# Patient Record
Sex: Male | Born: 1960 | Race: White | Hispanic: No | Marital: Married
Health system: Southern US, Community
[De-identification: ages and names within clinical notes are randomized; demographics above are authoritative.]

## PROBLEM LIST (undated history)

## (undated) DIAGNOSIS — S32020A Wedge compression fracture of second lumbar vertebra, initial encounter for closed fracture: Secondary | ICD-10-CM

## (undated) DIAGNOSIS — R519 Headache, unspecified: Secondary | ICD-10-CM

## (undated) DIAGNOSIS — I10 Essential (primary) hypertension: Secondary | ICD-10-CM

## (undated) DIAGNOSIS — F32A Depression, unspecified: Secondary | ICD-10-CM

## (undated) DIAGNOSIS — R51 Headache: Secondary | ICD-10-CM

## (undated) DIAGNOSIS — F329 Major depressive disorder, single episode, unspecified: Secondary | ICD-10-CM

## (undated) DIAGNOSIS — N200 Calculus of kidney: Secondary | ICD-10-CM

## (undated) DIAGNOSIS — E785 Hyperlipidemia, unspecified: Secondary | ICD-10-CM

## (undated) DIAGNOSIS — F419 Anxiety disorder, unspecified: Secondary | ICD-10-CM

## (undated) HISTORY — DX: Headache, unspecified: R51.9

## (undated) HISTORY — DX: Major depressive disorder, single episode, unspecified: F32.9

## (undated) HISTORY — DX: Calculus of kidney: N20.0

## (undated) HISTORY — DX: Wedge compression fracture of second lumbar vertebra, initial encounter for closed fracture: S32.020A

## (undated) HISTORY — DX: Hyperlipidemia, unspecified: E78.5

## (undated) HISTORY — DX: Headache: R51

## (undated) HISTORY — DX: Depression, unspecified: F32.A

## (undated) HISTORY — DX: Anxiety disorder, unspecified: F41.9

## (undated) HISTORY — DX: Essential (primary) hypertension: I10

## (undated) HISTORY — PX: APPENDECTOMY: SHX54

---

## 1999-07-08 ENCOUNTER — Emergency Department (HOSPITAL_COMMUNITY): Admission: EM | Admit: 1999-07-08 | Discharge: 1999-07-08 | Payer: Self-pay

## 2001-05-25 ENCOUNTER — Encounter (INDEPENDENT_AMBULATORY_CARE_PROVIDER_SITE_OTHER): Payer: Self-pay

## 2001-05-25 ENCOUNTER — Ambulatory Visit (HOSPITAL_COMMUNITY): Admission: RE | Admit: 2001-05-25 | Discharge: 2001-05-25 | Payer: Self-pay | Admitting: Gastroenterology

## 2009-06-28 ENCOUNTER — Ambulatory Visit (HOSPITAL_COMMUNITY): Admission: RE | Admit: 2009-06-28 | Discharge: 2009-06-28 | Payer: Self-pay | Admitting: Internal Medicine

## 2009-07-17 ENCOUNTER — Emergency Department (HOSPITAL_COMMUNITY): Admission: EM | Admit: 2009-07-17 | Discharge: 2009-07-17 | Payer: Self-pay | Admitting: Emergency Medicine

## 2009-10-30 ENCOUNTER — Emergency Department (HOSPITAL_COMMUNITY): Admission: EM | Admit: 2009-10-30 | Discharge: 2009-10-30 | Payer: Self-pay | Admitting: Emergency Medicine

## 2010-10-17 LAB — CBC
HCT: 41.8 % (ref 39.0–52.0)
Hemoglobin: 14.5 g/dL (ref 13.0–17.0)
MCHC: 34.7 g/dL (ref 30.0–36.0)
MCV: 94.6 fL (ref 78.0–100.0)
Platelets: 288 10*3/uL (ref 150–400)
RBC: 4.43 MIL/uL (ref 4.22–5.81)
RDW: 13.6 % (ref 11.5–15.5)
WBC: 5.9 10*3/uL (ref 4.0–10.5)

## 2010-10-17 LAB — BASIC METABOLIC PANEL
BUN: 12 mg/dL (ref 6–23)
CO2: 23 mEq/L (ref 19–32)
Calcium: 9.3 mg/dL (ref 8.4–10.5)
Chloride: 104 mEq/L (ref 96–112)
Creatinine, Ser: 0.96 mg/dL (ref 0.4–1.5)
GFR calc Af Amer: >60 mL/min (ref >60)
GFR calc non Af Amer: >60 mL/min (ref >60)
Glucose, Bld: 90 mg/dL (ref 70–99)
Potassium: 3.8 mEq/L (ref 3.5–5.1)
Sodium: 137 mEq/L (ref 135–145)

## 2010-10-17 LAB — DIFFERENTIAL
Basophils Relative: 1 % (ref 0–1)
Eosinophils Absolute: 0.1 10*3/uL (ref 0.0–0.7)
Eosinophils Relative: 1 % (ref 0–5)
Monocytes Absolute: 0.6 10*3/uL (ref 0.1–1.0)
Monocytes Relative: 11 % (ref 3–12)
Neutrophils Relative %: 57 % (ref 43–77)

## 2010-10-29 LAB — URINALYSIS, ROUTINE W REFLEX MICROSCOPIC
Glucose, UA: NEGATIVE mg/dL
Hgb urine dipstick: NEGATIVE
Specific Gravity, Urine: 1.02 (ref 1.005–1.030)
pH: 6 (ref 5.0–8.0)

## 2010-10-29 LAB — COMPREHENSIVE METABOLIC PANEL
ALT: 19 U/L (ref 0–53)
Albumin: 4.2 g/dL (ref 3.5–5.2)
Alkaline Phosphatase: 52 U/L (ref 39–117)
BUN: 12 mg/dL (ref 6–23)
Chloride: 101 mEq/L (ref 96–112)
Glucose, Bld: 102 mg/dL — ABNORMAL HIGH (ref 70–99)
Potassium: 4.3 mEq/L (ref 3.5–5.1)
Sodium: 136 mEq/L (ref 135–145)
Total Bilirubin: 0.8 mg/dL (ref 0.3–1.2)

## 2010-10-29 LAB — DIFFERENTIAL
Basophils Absolute: 0 10*3/uL (ref 0.0–0.1)
Basophils Relative: 0 % (ref 0–1)
Eosinophils Absolute: 0.1 10*3/uL (ref 0.0–0.7)
Monocytes Absolute: 0.8 10*3/uL (ref 0.1–1.0)
Neutro Abs: 3.8 10*3/uL (ref 1.7–7.7)
Neutrophils Relative %: 69 % (ref 43–77)

## 2010-10-29 LAB — CBC
HCT: 41.2 % (ref 39.0–52.0)
Hemoglobin: 14.5 g/dL (ref 13.0–17.0)
WBC: 5.5 10*3/uL (ref 4.0–10.5)

## 2010-10-29 LAB — HEMOCCULT GUIAC POC 1CARD (OFFICE): Fecal Occult Bld: NEGATIVE

## 2010-12-14 NOTE — Procedures (Signed)
[facility]  Patient:    Nicolas Perez, MACOMBER Visit Number: [id number] MRN: [id number]          Service Type: END Location: ENDO Attending Physician:  Orland Mustard Dictated by:   Llana Aliment. Randa Evens, M.D. Proc. Date: 05/25/01 Admit Date:  05/25/2001   CC:         Jamesetta Geralds, M.D.                           Procedure Report  PROCEDURE:  Colonoscopy and biopsy.  SURGEON:  James L. Randa Evens, M.D.  MEDICATIONS:  Fentanyl 112 mcg and Versed 10 mg IV.  SCOPE:  Pediatric Olympus video colonoscope.  INDICATIONS:  Heme positive stool, diarrhea in a gentleman with a strong family history of colon cancer.  DESCRIPTION OF PROCEDURE:  The procedure had been explained to the patient and consent obtained.  With the patient in the left lateral decubitus position, the Olympus video colonoscope of the pediatric-type was inserted and advanced under direct visualization.  The prep was excellent.  We were able to advance to the cecum.  The ileocecal valve was identified and entered for a short distance.  The terminal ileum was normal.  The scope was withdrawn and the cecum, ascending colon, hepatic flexure, transverse, descending, and sigmoid colon were all seen well and were normal.  No significant diverticular disease.  Mucosa normal throughout.  No evidence of colitis.  Several random biopsies taken to evaluate for microscopic colitis.  The scope was withdrawn. The patient tolerated the procedure well.  ASSESSMENT: 1. No evidence of colon polyps. 2. Normal mucosa.  No evidence of colitis or ileitis.  PLAN:  Would recommend repeating in five years due to his family history of colon cancer, and will see back in the office in two months to recheck his stools. Dictated by:   Llana Aliment. Randa Evens, M.D. Attending Physician:  Orland Mustard DD:  05/25/01 TD:  05/26/01 Job: [id number]

## 2011-02-15 ENCOUNTER — Emergency Department (HOSPITAL_COMMUNITY): Payer: Managed Care, Other (non HMO)

## 2011-02-15 ENCOUNTER — Inpatient Hospital Stay (HOSPITAL_COMMUNITY)
Admission: EM | Admit: 2011-02-15 | Discharge: 2011-02-17 | DRG: 313 | Disposition: A | Discharge status: Home / Self Care | Payer: Managed Care, Other (non HMO) | Attending: Internal Medicine | Admitting: Internal Medicine

## 2011-02-15 DIAGNOSIS — K219 Gastro-esophageal reflux disease without esophagitis: Secondary | ICD-10-CM | POA: Diagnosis present

## 2011-02-15 DIAGNOSIS — G252 Other specified forms of tremor: Secondary | ICD-10-CM | POA: Diagnosis present

## 2011-02-15 DIAGNOSIS — F341 Dysthymic disorder: Secondary | ICD-10-CM | POA: Diagnosis present

## 2011-02-15 DIAGNOSIS — R0789 Other chest pain: Principal | ICD-10-CM | POA: Diagnosis present

## 2011-02-15 DIAGNOSIS — F1010 Alcohol abuse, uncomplicated: Secondary | ICD-10-CM | POA: Diagnosis present

## 2011-02-15 DIAGNOSIS — E785 Hyperlipidemia, unspecified: Secondary | ICD-10-CM | POA: Diagnosis present

## 2011-02-15 DIAGNOSIS — G43909 Migraine, unspecified, not intractable, without status migrainosus: Secondary | ICD-10-CM | POA: Diagnosis present

## 2011-02-15 DIAGNOSIS — G250 Essential tremor: Secondary | ICD-10-CM | POA: Diagnosis present

## 2011-02-15 DIAGNOSIS — I10 Essential (primary) hypertension: Secondary | ICD-10-CM | POA: Diagnosis present

## 2011-02-15 LAB — CK TOTAL AND CKMB (NOT AT ARMC)
CK, MB: 1.8 ng/mL (ref 0.3–4.0)
Relative Index: 1.4 (ref 0.0–2.5)

## 2011-02-15 LAB — COMPREHENSIVE METABOLIC PANEL
ALT: 31 U/L (ref 0–53)
Alkaline Phosphatase: 50 U/L (ref 39–117)
BUN: 15 mg/dL (ref 6–23)
CO2: 24 mEq/L (ref 19–32)
Chloride: 97 mEq/L (ref 96–112)
GFR calc Af Amer: >60 mL/min (ref >60)
GFR calc non Af Amer: >60 mL/min (ref >60)
Glucose, Bld: 80 mg/dL (ref 70–99)
Potassium: 3.7 mEq/L (ref 3.5–5.1)
Sodium: 133 mEq/L — ABNORMAL LOW (ref 135–145)
Total Bilirubin: 0.3 mg/dL (ref 0.3–1.2)

## 2011-02-15 LAB — CBC
Hemoglobin: 15.2 g/dL (ref 13.0–17.0)
MCH: 31.2 pg (ref 26.0–34.0)
MCHC: 35.1 g/dL (ref 30.0–36.0)
Platelets: 235 10*3/uL (ref 150–400)

## 2011-02-15 LAB — PROTIME-INR
INR: 0.92 (ref 0.00–1.49)
Prothrombin Time: 12.6 seconds (ref 11.6–15.2)

## 2011-02-15 LAB — MAGNESIUM: Magnesium: 2.3 mg/dL (ref 1.5–2.5)

## 2011-02-16 LAB — CARDIAC PANEL(CRET KIN+CKTOT+MB+TROPI)
CK, MB: 1.4 ng/mL (ref 0.3–4.0)
Relative Index: INVALID (ref 0.0–2.5)
Relative Index: INVALID (ref 0.0–2.5)
Troponin I: <0.3 ng/mL (ref <0.30)
Troponin I: <0.3 ng/mL (ref <0.30)

## 2011-02-16 LAB — LIPID PANEL
HDL: 49 mg/dL (ref >39)
LDL Cholesterol: 181 mg/dL — ABNORMAL HIGH (ref 0–99)
Triglycerides: 139 mg/dL (ref <150)

## 2011-02-17 LAB — CBC
HCT: 45.3 % (ref 39.0–52.0)
Hemoglobin: 15.1 g/dL (ref 13.0–17.0)
MCH: 30 pg (ref 26.0–34.0)
MCHC: 33.3 g/dL (ref 30.0–36.0)
RBC: 5.03 MIL/uL (ref 4.22–5.81)

## 2011-02-17 LAB — CARDIAC PANEL(CRET KIN+CKTOT+MB+TROPI)
CK, MB: 1.3 ng/mL (ref 0.3–4.0)
Relative Index: INVALID (ref 0.0–2.5)
Relative Index: INVALID (ref 0.0–2.5)
Total CK: 84 U/L (ref 7–232)
Troponin I: <0.3 ng/mL (ref <0.30)
Troponin I: <0.3 ng/mL (ref <0.30)
Troponin I: <0.3 ng/mL (ref <0.30)

## 2011-02-17 LAB — BASIC METABOLIC PANEL
BUN: 16 mg/dL (ref 6–23)
CO2: 27 mEq/L (ref 19–32)
Calcium: 9.6 mg/dL (ref 8.4–10.5)
GFR calc non Af Amer: >60 mL/min (ref >60)
Glucose, Bld: 98 mg/dL (ref 70–99)
Sodium: 136 mEq/L (ref 135–145)

## 2011-02-17 LAB — VITAMIN B12: Vitamin B-12: 425 pg/mL (ref 211–911)

## 2011-02-18 NOTE — H&P (Signed)
NAME:  Nicolas Perez, Nicolas Perez NO.:  [id number]  MEDICAL RECORD NO.:  [id number]  LOCATION:  WLED                         FACILITY:  [facility]  PHYSICIAN:  Della Goo, M.D. DATE OF BIRTH:  1960/12/10  DATE OF ADMISSION:  02/15/2011 DATE OF DISCHARGE:                             HISTORY & PHYSICAL   DATE OF ADMISSION:  02/15/2011  PRIMARY CARE PHYSICIAN:  Fleet Contras, M.D.  CHIEF COMPLAINT:  Chest pain, headache.  HISTORY OF PRESENT ILLNESS:  This is a 50 year old male with a history of hypertension who presents to the emergency department at [facility] secondary to complaints of severe stabbing chest pain which started when he was at work on his job at 3:00 p.m. today.  The patient states the pain was worse than it had ever been.  The pain radiated across his chest.  He reported having associated symptoms of sweating and shortness of breath.  He states the pain lasted about 12 to 15 minutes and then began to slowly resolve.  He reported the pain was 10/10 at the worst and then began to decrease to about 3/10.  When he was at work, he went to the nurse on the job for evaluation and was  sent to the emergency department for further evaluation.   The patient also stated that he has been having a headache for the past 2 weeks on the left side of his head radiating down the back of his head into his neck and shoulders.  He reports at times he has a headache behind his left eye.  He has been taking Naprosyn 2 to 3 times a day for this.    The patient does have a family history of coronary artery disease.  His sister and father had coronary artery disease.  The patient is a nonsmoker.  PAST MEDICAL HISTORY:  Significant for hypertension, hyperlipidemia, nephrolithiasis, compression fracture at L2, anxiety and depression.  PAST SURGICAL HISTORY:  Appendectomy.  MEDICATIONS:  Will need to be further verified.  He takes Naprosyn, Lomotil, Zoloft, Xanax  and Norvasc.  ALLERGIES:  TETRACYCLINE.  SOCIAL HISTORY:  The patient is married, with children.  He is a nonsmoker.  He reports drinking alcohol variable amounts.  He states at times he drinks none and some nights he drinks 2 beers or 4 beers or 6 beers.  He denies any illicit drug usage.  FAMILY HISTORY:  Positive for coronary artery disease in his sister and father.  Sister died of Tessie Fass disease in her 31s.  Mother had hypertension, stroke and had breast cancer in which she underwent treatment and had subsequent leukemia and died.  REVIEW OF SYSTEMS:  Pertinents as mentioned in HPI.  PHYSICAL EXAMINATION FINDINGS:  GENERAL:  This is a 50 year old well- nourished, well-developed Caucasian male who is in no visible discomfort or acute distress. VITAL SIGNS:  Temperature 98.0, blood pressure initially 168/121, heart rate 80, respirations 20, O2 sats 100% on supplemental oxygen. HEENT EXAMINATION:  Normocephalic, atraumatic.  Pupils equally round and reactive to light.  Extraocular movements are intact.  Funduscopic benign.  There is no scleral icterus.  Nares are patent bilaterally. Oropharynx is  clear. NECK:  Supple.  Full range of motion.  No thyromegaly, adenopathy, or jugular venous distention. CARDIOVASCULAR:  Regular rate and rhythm.  No murmurs, gallops or rubs appreciated.  Normal S1, S2. LUNGS:  Clear to auscultation bilaterally.  Normal excursion.  Breathing is unlabored.  Chest wall is nontender.  Nondisplaced PMI.Marland Kitchen  ABDOMEN: Positive bowel sounds, soft, nontender, nondistended.  No hepatosplenomegaly.  No rebound, no guarding. EXTREMITIES:  Without cyanosis, clubbing or edema. NEUROLOGIC EXAMINATION:  The patient is alert and oriented x3.  Cranial nerves are intact.  Motor and sensory function also intact.  Cerebellar function intact.  Gait has not been assessed.  LABORATORY STUDIES:  White blood cell count 6.6, hemoglobin 15.2, hematocrit 43.3, platelets  135.  Protime 12.6, INR 0.92 and PTT 28. Sodium 133, potassium 3.7, chloride 97, CO2 of 24, BUN 15, creatinine 1.04, glucose 80, albumin 4.3, AST 23, ALT 31, calcium 9.9.  Cardiac enzymes with total CK of 128, CK-MB 1.8, relative index 1.4 and troponin less than 0.30.  Magnesium level 2.3.  Chest x-ray reveals no acute disease findings.  CT scan of the head performed revealing no acute intracranial hemorrhage.  No focal mass lesions.  No signs of acute infarction.  No midline shift or mass effect.  Normal head CT.  EKG reveals a normal sinus rhythm and no acute ST-segment changes were seen.  ASSESSMENT:  A 50 year old male being admitted with: 1. Chest pain. 2. Malignant hypertension/hypertensive urgency. 3. Cephalgia secondary to number #2. 4. Hyperlipidemia. 5. Anxiety and depression.  PLAN:  The patient will be admitted to a telemetry area for cardiac monitoring and cardiac enzymes will be performed.  The patient will be placed on nitroglycerin paste, oxygen, aspirin therapies and p.r.n. medications will be ordered for his elevated blood pressures.  His regular medications will be further verified as well.  He is already on Norvasc.  For his blood pressure, it is not clear at this time whether the patient has controlled blood pressure on his current medications. Pain medication will also be ordered for his headache.  This may be a tension headache. However, the headache may also be due to uncontrolled hypertension.  The patient will be placed on DVT prophylaxis and further workup will ensue pending results of his clinical studies and his clinical course.  The patient is a full code.     Della Goo, M.D.     HJ/MEDQ  D:  02/15/2011  T:  02/15/2011  Job:  [id number]  cc:   Fleet Contras, M.D. Fax: [phone]  Electronically Signed by Della Goo M.D. on 02/18/2011 12:49:27 PM

## 2011-02-25 ENCOUNTER — Telehealth: Payer: Self-pay | Admitting: *Deleted

## 2011-02-25 NOTE — Telephone Encounter (Signed)
Called patient and advised him that his insurance carrier would not cover a stress myoview but would cover a stress echo. This change was approved by Dr.Ross.  The patient will come tomorrow for a stress echo at 345pm for a 4 pm test.

## 2011-02-26 ENCOUNTER — Encounter (HOSPITAL_COMMUNITY): Payer: Managed Care, Other (non HMO) | Admitting: Radiology

## 2011-02-26 ENCOUNTER — Ambulatory Visit (HOSPITAL_COMMUNITY): Payer: Managed Care, Other (non HMO) | Attending: Internal Medicine

## 2011-02-26 ENCOUNTER — Ambulatory Visit (HOSPITAL_COMMUNITY): Payer: Managed Care, Other (non HMO) | Attending: Internal Medicine | Admitting: Radiology

## 2011-02-26 DIAGNOSIS — R072 Precordial pain: Secondary | ICD-10-CM | POA: Insufficient documentation

## 2011-02-26 DIAGNOSIS — R0989 Other specified symptoms and signs involving the circulatory and respiratory systems: Secondary | ICD-10-CM

## 2011-02-26 DIAGNOSIS — E785 Hyperlipidemia, unspecified: Secondary | ICD-10-CM | POA: Insufficient documentation

## 2011-02-26 DIAGNOSIS — R079 Chest pain, unspecified: Secondary | ICD-10-CM | POA: Insufficient documentation

## 2011-02-26 DIAGNOSIS — I10 Essential (primary) hypertension: Secondary | ICD-10-CM | POA: Insufficient documentation

## 2011-02-26 DIAGNOSIS — Z8249 Family history of ischemic heart disease and other diseases of the circulatory system: Secondary | ICD-10-CM | POA: Insufficient documentation

## 2011-02-26 DIAGNOSIS — R0789 Other chest pain: Secondary | ICD-10-CM

## 2011-02-26 MED ORDER — ATROPINE SULFATE 0.1 MG/ML IJ SOLN
0.5000 mg | Freq: Once | INTRAMUSCULAR | Status: AC
  Administered 2011-02-26: 0.5 mg via INTRAVENOUS

## 2011-02-26 MED ORDER — SODIUM CHLORIDE 0.9 % IV SOLN: 40.0000 ug/kg | Freq: Once | INTRAVENOUS | Status: DC

## 2011-02-27 ENCOUNTER — Encounter: Payer: Self-pay | Admitting: *Deleted

## 2011-03-01 ENCOUNTER — Encounter: Payer: Self-pay | Admitting: Internal Medicine

## 2011-03-04 ENCOUNTER — Ambulatory Visit: Payer: Managed Care, Other (non HMO) | Admitting: Internal Medicine

## 2011-03-15 NOTE — Discharge Summary (Signed)
NAME:  Nicolas Perez, Nicolas Perez NO.:  [id number]  MEDICAL RECORD NO.:  [id number]  LOCATION:  1412                         FACILITY:  [facility]  PHYSICIAN:  Rosanna Randy, MDDATE OF BIRTH:  June 30, 1961  DATE OF ADMISSION:  02/15/2011 DATE OF DISCHARGE:  02/17/2011                              DISCHARGE SUMMARY   PRIMARY CARE PHYSICIAN:  Fleet Contras, MD.  DISCHARGE DIAGNOSES: 1. Atypical chest pain with typical features, negative cardiac     markers, and no electrocardiographic changes. 2. Essential tremors. 3. Hyperlipidemia. 4. Accelerated hypertension. 5. Migraines. 6. Gastroesophageal reflux disease. 7. Anxiety and depression. 8. Alcohol abuse.  DISCHARGE MEDICATIONS: 1. Amlodipine 10 mg by mouth daily. 2. Aspirin 81 mg 1 tablet by mouth daily. 3. Metoprolol 25 mg by mouth twice a day. 4. Pravachol 40 mg by mouth daily. 5. Protonix 40 mg by mouth daily. 6. Topiramate 25 mg by mouth daily at bedtime.7. Alprazolam 1 mg 1 tablet by mouth 3 times a day as needed for     anxiety. 8. Sertraline 100 mg 2 tablets by mouth every morning.  DISPOSITION AND FOLLOWUP:  Patient had been discharged in stable and improved condition, currently not complaining of any chest pain.  No shortness of breath, palpitations or any other acute discomfort. Patient had been instructed to follow a low-sodium diet, to cut down and quit drinking alcohol, and to arrange a followup appointment with primary care physician over the next 7-10 days.  It will be important at that time to repeat the basic metabolic panel to follow on electrolytes and also renal function and to adjust the patient's blood pressure medications as needed after following on his vital signs.  Patient will also call [facility] in order to arrange a stress test as an outpatient for his chest pain with typical features and risk factors in order to finish a stratification workup for his main  complaint.  PROCEDURES PERFORMED DURING THIS HOSPITALIZATION:  Patient has a CT of the head without contrast due to the symptoms of headache associated with his accelerated hypertension.  Patient has no acute intracranial abnormalities seen on the CT.  He also had on February 15, 2011, a chest x- ray 2 views that demonstrated no acute cardiopulmonary process.  No other procedures were performed during this admission.  A curbside telephone call to [facility] was made in order to arrange outpatient stress test for Mr. [name].  No other consultations were made during this admission.  HISTORY OF PRESENT ILLNESS:  For full details, please refer to dictation done by Dr. Lovell Sheehan on February 15, 2011.  Briefly, this is a 50 year old male with a history of hypertension, hyperlipidemia, family history of coronary artery disease, and alcohol abuse, who came into the hospital complaining of significant stabbing chest pain that lasted about 12-15 minutes, 10/10, associated with shortness of breath and diaphoresis.  LABORATORY DATA:  Pertinent laboratory data throughout this admission: A CBC with differential demonstrated white blood cells of 6.6, hemoglobin 15.2, platelets 235.  PT 12.6, creatinine 0.92, PTT 28. Cardiac markers negative x4.  Magnesium 2.3.  TSH 3.820.  A lipid profile demonstrated LDL of 181  and total cholesterol of 258, triglyceride 139 and HDL 49, a vitamin B12 level 425.  At discharge, CBC showed a white blood cells of 5.1, hemoglobin 15.1, platelets 237, MCV was 90.1.  BMET:  Sodium 136, potassium 4.0, chloride 100, bicarb 27, glucose 98, BUN 16, creatinine 0.93, calcium 9.6.  HOSPITAL COURSE BY PROBLEM: 1. Patient's chest pain, atypical with typical features due to his     family history:  A stress test as an outpatient is going to be     arranged through [facility].  In this hospitalization,     cardiac markers were negative x4.  There were no EKG changes,  and     telemetry without abnormality.  Patient was started on beta     blocker, aspirin, and also statins. 2. Accelerated hypertension:  Improved with the use of nitrate,     medications adjusted.  He is now on Lopressor, 10 mg of amlodipine     daily, and will follow with primary care physician in order to have     further adjustment as needed to control his blood pressure.     Patient will also follow a low-sodium diet. 3. Essential tremors:  We are expecting that this is going to improve     with the use of beta-blockers.  He will follow with primary care     physician for further adjustment as needed. 4. Hyperlipidemia:  Patient started on Pravachol 40 mg by mouth daily     and instructed to follow a low-fat diet. 5. Migraines:  He had been started on Topamax and will continue using     Tylenol as needed for pain control. 6. Gastroesophageal reflux disease:  We are going to continue using     Protonix. 7. Anxiety and depression:  Patient is to continue the use of     alprazolam and sertraline.  DISCHARGE PHYSICAL EXAMINATION:  VITAL SIGNS:  Temperature 97.4, heart rate 73, respiratory rate 18, blood pressure 136/89, oxygen saturation 98% on room air. GENERAL:  Patient in no acute distress. RESPIRATORY SYSTEM:  Clear to auscultation bilaterally.  No rhonchi, wheezing, or crackles. HEART:  Regular rate and rhythm.  No murmurs.  No gallops. ABDOMEN:  Soft.  No tenderness.  No distention.  Positive bowel sounds. EXTREMITIES:  No edema.  Bilateral feet on the sole demonstrated urticaria rash that will need to be followed as an outpatient with primary care physician for further workup. NEUROLOGIC:  Nonfocal.     Rosanna Randy, MD     CEM/MEDQ  D:  02/17/2011  T:  02/17/2011  Job:  [id number]  cc:   Fleet Contras, M.D. Fax: [phone]  Electronically Signed by Vassie Loll MD on 03/15/2011 05:41:36 PM

## 2012-04-28 IMAGING — CT CT HEAD W/O CM
1 series · 16 of 30 positions shown, 20 images · non-contrast
Comparison: None.

CLINICAL DATA: Headache, blurred vision

CT HEAD WITHOUT CONTRAST
TECHNIQUE: Contiguous axial images were obtained from the base of
the skull through the vertex without contrast.

[Series 2: head_seq 4.5 h37s st · axial · 0.43mm/px · z∈[-112,+32]mm · 16 of 36 slices shown, 20 images]
[im 2/36  brain]
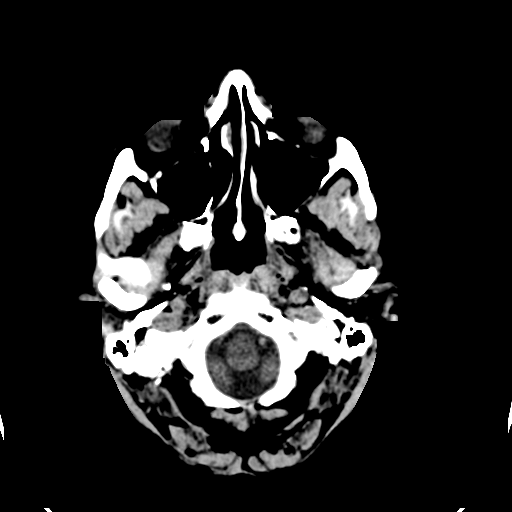
[im 2/36  bone]
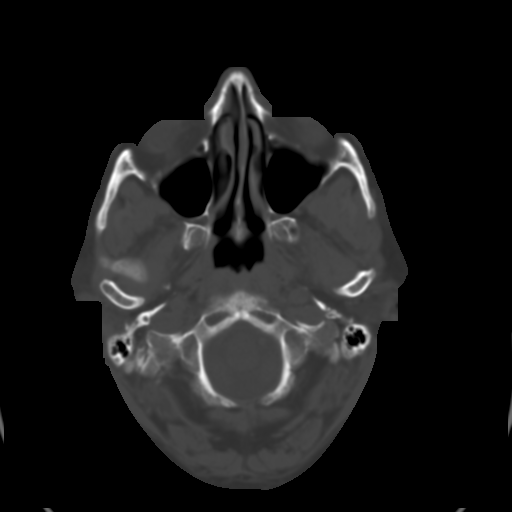
[im 4/36  brain]
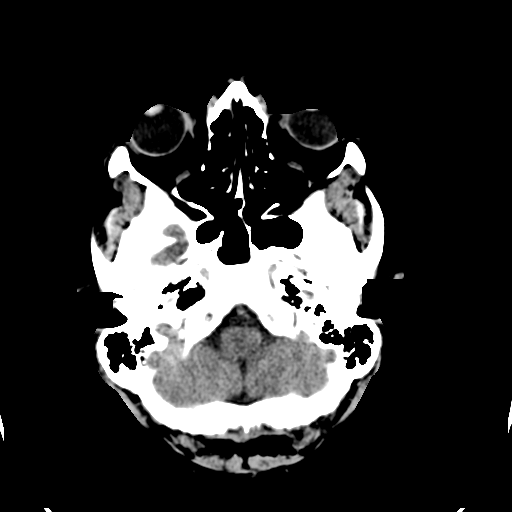
[im 7/36  brain]
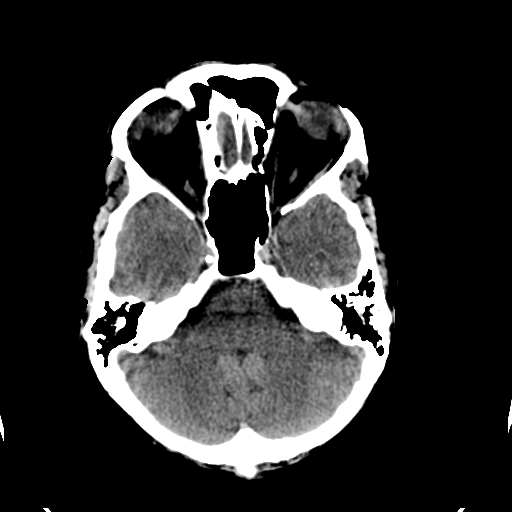
[im 9/36  brain]
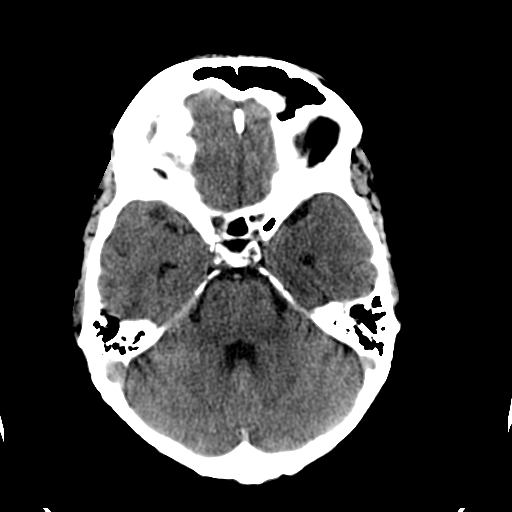
[im 10/36  brain]
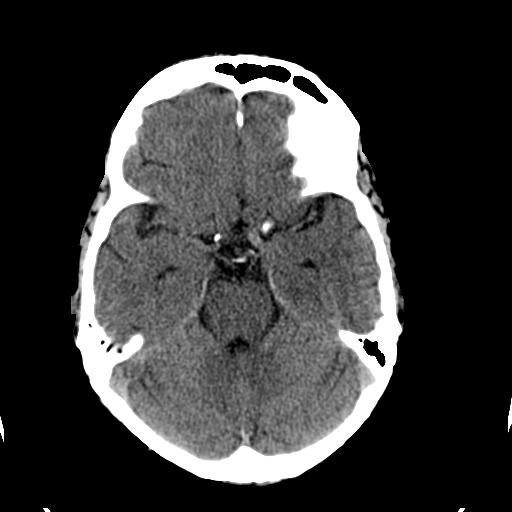
[im 10/36  bone]
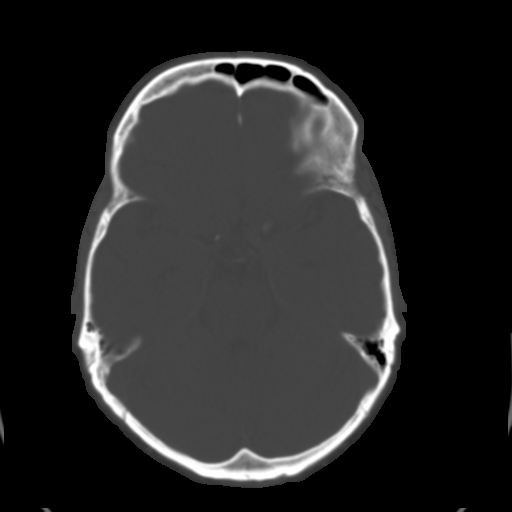
[im 13/36  brain]
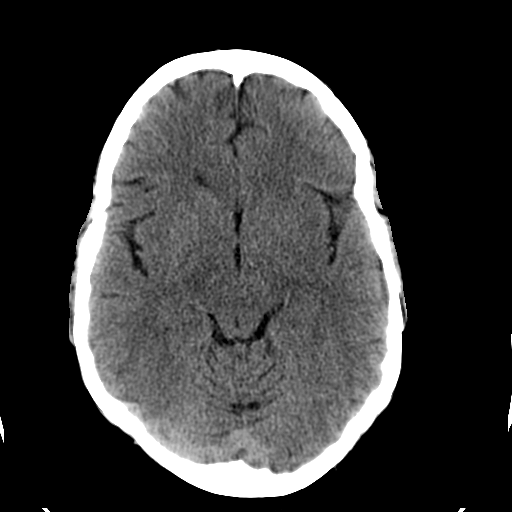
[im 15/36  brain]
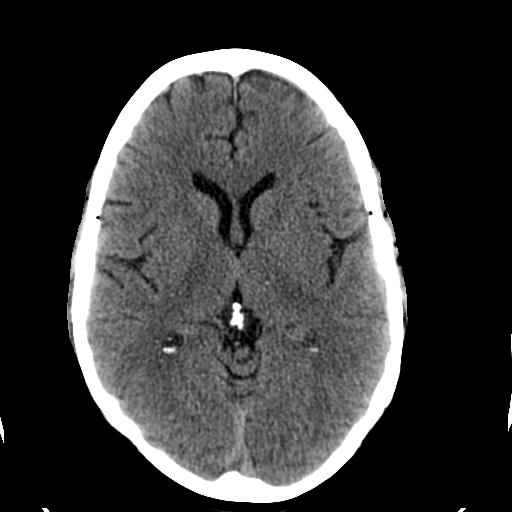
[im 17/36  brain]
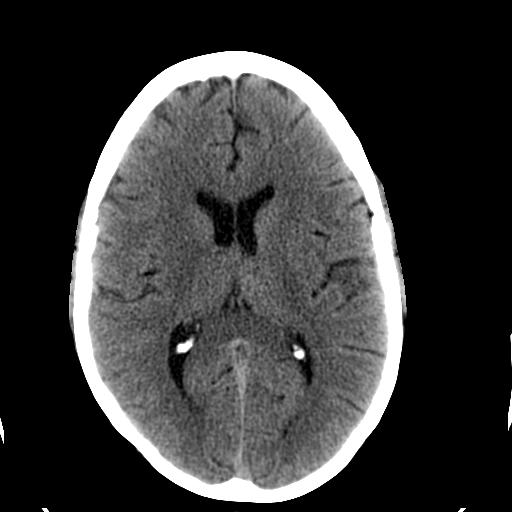
[im 19/36  brain]
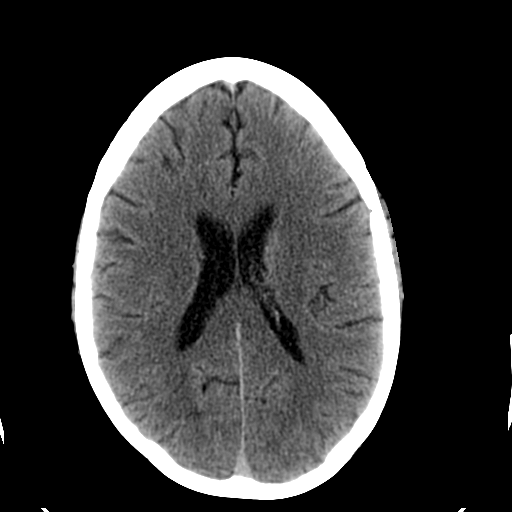
[im 19/36  bone]
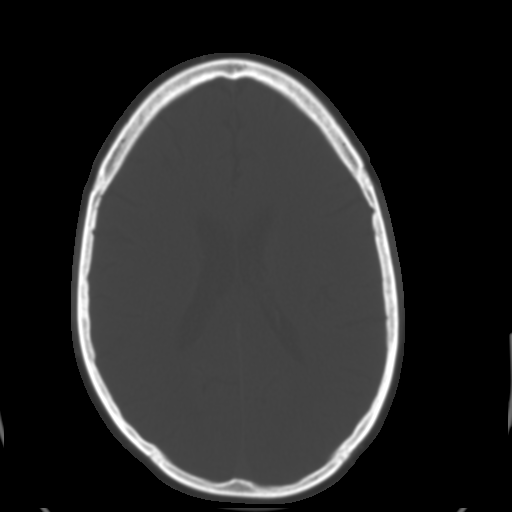
[im 21/36  brain]
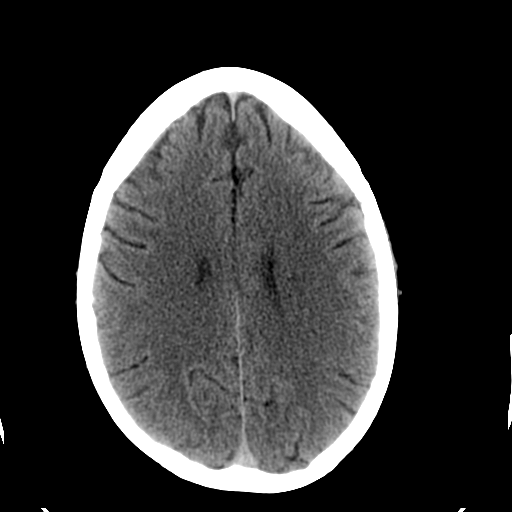
[im 23/36  brain]
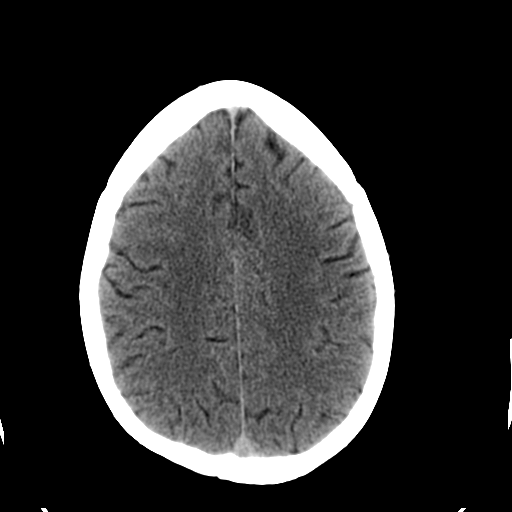
[im 26/36  brain]
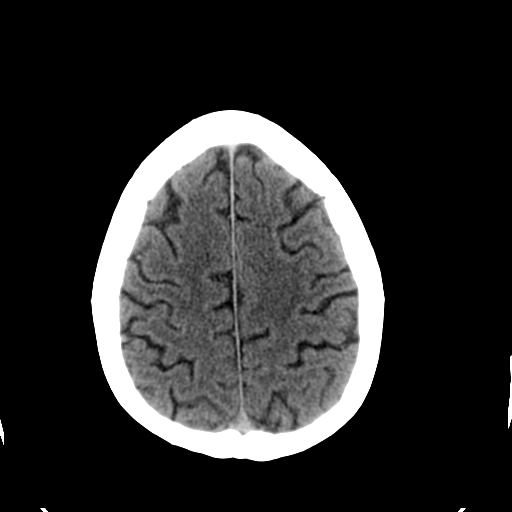
[im 27/36  brain]
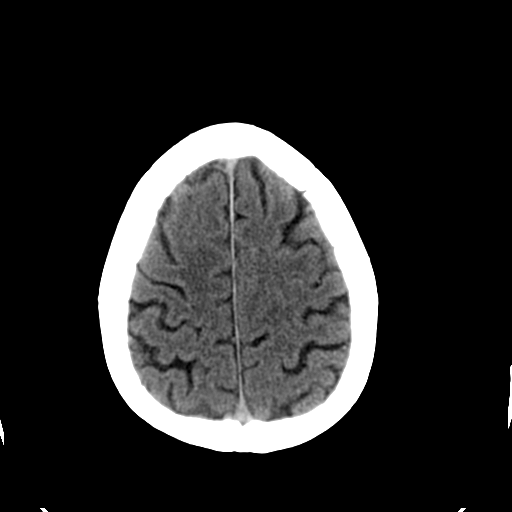
[im 27/36  bone]
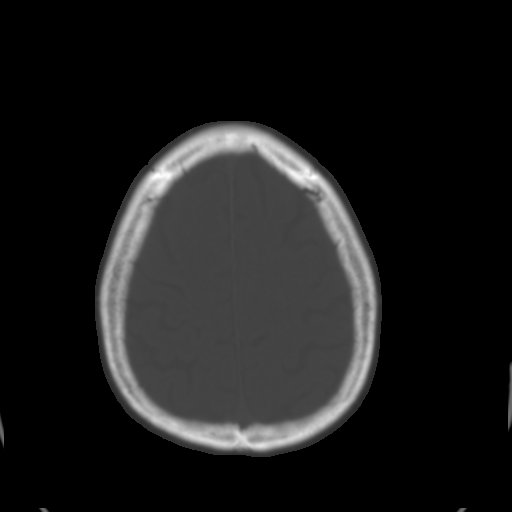
[im 29/36  brain]
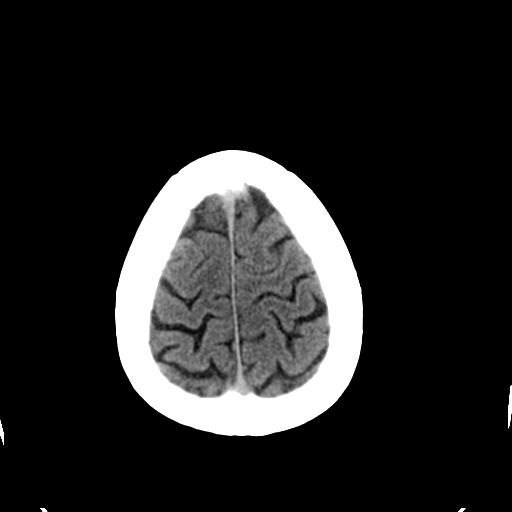
[im 32/36  brain]
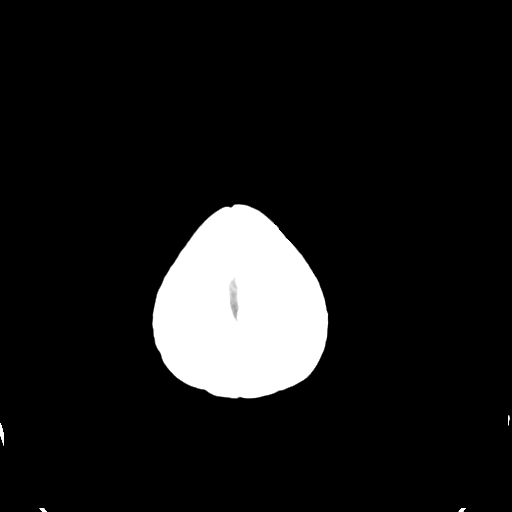
[im 34/36  brain]
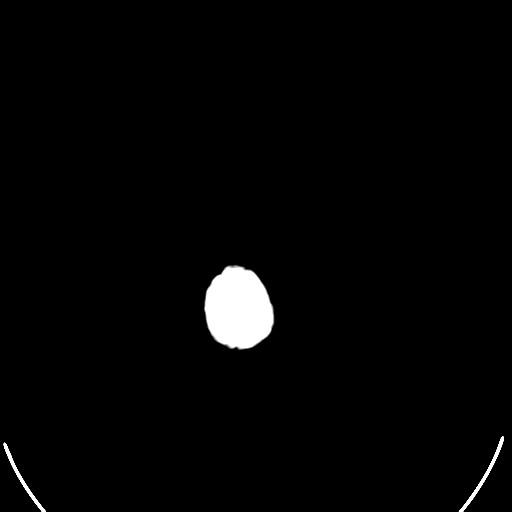

[16 of 30 positions shown; findings below may reference images not displayed]

FINDINGS: No acute intracranial hemorrhage.  No focal mass lesion.
No CT evidence of acute infarction.  No midline shift or mass
effect.

Paranasal sinuses and mastoid air cells are clear.  Orbits are
normal.
IMPRESSION: Normal head CT.

## 2013-09-22 ENCOUNTER — Encounter: Payer: Self-pay | Admitting: Internal Medicine

## 2016-09-19 ENCOUNTER — Emergency Department (HOSPITAL_COMMUNITY): Payer: BLUE CROSS/BLUE SHIELD

## 2016-09-19 ENCOUNTER — Emergency Department (HOSPITAL_COMMUNITY)
Admission: EM | Admit: 2016-09-19 | Discharge: 2016-09-19 | Disposition: A | Discharge status: Home / Self Care | Payer: BLUE CROSS/BLUE SHIELD | Attending: Emergency Medicine | Admitting: Emergency Medicine

## 2016-09-19 ENCOUNTER — Encounter (HOSPITAL_COMMUNITY): Payer: Self-pay | Admitting: Emergency Medicine

## 2016-09-19 DIAGNOSIS — Z79899 Other long term (current) drug therapy: Secondary | ICD-10-CM | POA: Insufficient documentation

## 2016-09-19 DIAGNOSIS — R0789 Other chest pain: Secondary | ICD-10-CM | POA: Diagnosis present

## 2016-09-19 DIAGNOSIS — F419 Anxiety disorder, unspecified: Secondary | ICD-10-CM

## 2016-09-19 DIAGNOSIS — I10 Essential (primary) hypertension: Secondary | ICD-10-CM | POA: Insufficient documentation

## 2016-09-19 LAB — BASIC METABOLIC PANEL
Anion gap: 6 (ref 5–15)
BUN: 12 mg/dL (ref 6–20)
CO2: 24 mmol/L (ref 22–32)
Calcium: 9.1 mg/dL (ref 8.9–10.3)
Chloride: 105 mmol/L (ref 101–111)
Creatinine, Ser: 0.85 mg/dL (ref 0.61–1.24)
GFR calc Af Amer: >60 mL/min (ref >60)
GFR calc non Af Amer: >60 mL/min (ref >60)
Glucose, Bld: 110 mg/dL — ABNORMAL HIGH (ref 65–99)
Potassium: 4.3 mmol/L (ref 3.5–5.1)
Sodium: 135 mmol/L (ref 135–145)

## 2016-09-19 LAB — I-STAT TROPONIN, ED
Troponin i, poc: 0 ng/mL (ref 0.00–0.08)
Troponin i, poc: 0 ng/mL (ref 0.00–0.08)

## 2016-09-19 LAB — CBC
HCT: 44.1 % (ref 39.0–52.0)
Hemoglobin: 15.6 g/dL (ref 13.0–17.0)
MCH: 32.4 pg (ref 26.0–34.0)
MCHC: 35.4 g/dL (ref 30.0–36.0)
MCV: 91.5 fL (ref 78.0–100.0)
Platelets: 289 10*3/uL (ref 150–400)
RBC: 4.82 MIL/uL (ref 4.22–5.81)
RDW: 13.3 % (ref 11.5–15.5)
WBC: 5 10*3/uL (ref 4.0–10.5)

## 2016-09-19 NOTE — ED Triage Notes (Signed)
Patient c/o right sided chest pain since yesterday at work. Pt reports pain radiates to back and felt like he was going to pass out when pain came on.  Patient states his BP has been running high here lately and lots of stress at work.

## 2016-09-19 NOTE — Discharge Instructions (Signed)
Please follow up with your primary care provider for further evaluation of your chest pain.  You will likely benefit from a cardiac stress test outpatient if your primary care provider deem appropriate.  Please decrease alcohol intake.  Return to the ER if your condition worsen or if you have other concerns.

## 2016-09-19 NOTE — ED Provider Notes (Signed)
[facility]-EMERGENCY DEPT Provider Note   CSN: [id number] Arrival date & time: 09/19/16  1054     History   Chief Complaint Chief Complaint  Patient presents with  . Chest Pain    HPI Nicolas Perez is a 56 y.o. male.  HPI   56 year old male with history of hyperlipidemia, hypertension, anxiety resenting complaining of chest pain. Patient report for the past week he has been expressing intermittent chest discomfort usually brought on by stress. He described his chest discomfort as a pressure sensation to his mid chest, radiates to his right shoulder and arm, usually lasting for 3-5 minutes and occasionally radiates to his back. Yesterday he expressing moderate discomfort after he had "a discussion" at his desk at work. He went home to check his blood pressure and states that it was elevated in the 160s/170 systolic. He tries taking his blood pressure medication with throughout the day today he has been having this same discomfort to his chest. Currently rated as 7 out of 10. Endorse occasional shortness of breath but not always. Denies any exertional chest pain lightheadedness of dizziness. He denies tobacco use and admits to drinking alcohol regularly. Does report family history of cardiac disease but no premature cardiac death. No prior history of PE or DVT, no recent surgery, prolonged bed rest, unilateral leg swelling or calf pain. He has not had any provocative cardiac testing within the past 5 years. He report taking Xanax does help with his chest pain. He denies any strenuous activities or heavy lifting.  Past Medical History:  Diagnosis Date  . Anxiety   . Cephalgia   . Chest pain   . Compression fracture of L2 lumbar vertebra (HCC)   . Depression   . Hyperlipidemia   . Hypertension   . Nephrolithiasis     There are no active problems to display for this patient.   Past Surgical History:  Procedure Laterality Date  . APPENDECTOMY         Home Medications    Prior to  Admission medications   Medication Sig Start Date End Date Taking? Authorizing Provider  ALPRAZolam (XANAX PO) Take by mouth.      Historical Provider, MD  AmLODIPine Besylate (NORVASC PO) Take by mouth.      Historical Provider, MD  Diphenoxylate-Atropine (LOMOTIL PO) Take by mouth.      Historical Provider, MD  Naproxen (NAPROSYN PO) Take by mouth.      Historical Provider, MD  Sertraline HCl (ZOLOFT PO) Take by mouth.      Historical Provider, MD    Family History Family History  Problem Relation Age of Onset  . Hypertension Mother   . Stroke Mother   . Breast cancer Mother   . Leukemia Mother   . Coronary artery disease Sister   . Coronary artery disease Father     Social History Social History  Substance Use Topics  . Smoking status: Unknown If Ever Smoked  . Smokeless tobacco: Never Used  . Alcohol use Yes     Comment: Variable amounts. At times he drinks none and some nights he drinks 2, 4, or 6 beers.     Allergies   Tetracyclines & related   Review of Systems Review of Systems  All other systems reviewed and are negative.    Physical Exam Updated Vital Signs BP 161/92 (BP Location: Left Arm)   Pulse 73   Temp 98.3 F (36.8 C) (Oral)   Resp 14   Ht 5\' 8"  (  1.727 m)   Wt 83.5 kg   SpO2 98%   BMI 27.98 kg/m   Physical Exam  Constitutional: He is oriented to person, place, and time. He appears well-developed and well-nourished. No distress.  HENT:  Head: Atraumatic.  Eyes: Conjunctivae are normal.  Neck: Neck supple.  Cardiovascular: Normal rate, regular rhythm and intact distal pulses.   Pulmonary/Chest: Effort normal and breath sounds normal. No respiratory distress. He has no wheezes. He has no rales. He exhibits no tenderness.  Abdominal: Soft. He exhibits no distension.  Musculoskeletal: He exhibits no edema.  Neurological: He is alert and oriented to person, place, and time.  Skin: No rash noted.  Psychiatric: He has a normal mood and affect.   Nursing note and vitals reviewed.    ED Treatments / Results  Labs (all labs ordered are listed, but only abnormal results are displayed) Labs Reviewed  BASIC METABOLIC PANEL - Abnormal; Notable for the following:       Result Value   Glucose, Bld 110 (*)    All other components within normal limits  CBC  I-STAT TROPOININ, ED  I-STAT TROPOININ, ED    EKG  EKG Interpretation None     ED ECG REPORT   Date: 09/19/2016  Rate: 72  Rhythm: normal sinus rhythm  QRS Axis: normal  Intervals: normal  ST/T Wave abnormalities: normal  Conduction Disutrbances:none  Narrative Interpretation:   Old EKG Reviewed: unchanged  I have personally reviewed the EKG tracing and agree with the computerized printout as noted.   Radiology Dg Chest 2 View  Result Date: 09/19/2016 CLINICAL DATA:  Chest pain. EXAM: CHEST  2 VIEW COMPARISON:  02/15/2011 . FINDINGS: Mediastinum hilar structures are normal. Lungs are clear. No pleural effusion or pneumothorax. No acute bony abnormality . IMPRESSION: No acute cardiopulmonary disease. Electronically Signed   By: Maisie Fus  Register   On: 09/19/2016 11:23    Procedures Procedures (including critical care time)  Medications Ordered in ED Medications - No data to display   Initial Impression / Assessment and Plan / ED Course  I have reviewed the triage vital signs and the nursing notes.  Pertinent labs & imaging results that were available during my care of the patient were reviewed by me and considered in my medical decision making (see chart for details).     BP 154/91 (BP Location: Right Arm)   Pulse 70   Temp 98.3 F (36.8 C) (Oral)   Resp 18   Ht 5\' 8"  (1.727 m)   Wt 83.5 kg   SpO2 96%   BMI 27.98 kg/m    Final Clinical Impressions(s) / ED Diagnoses   Final diagnoses:  Atypical chest pain  Anxiety    New Prescriptions New Prescriptions   No medications on file   11:52 AM Patient presents with chest pain atypical for  ACS. Low suspicious for PE based on well's criteria. Workup initiated.  He does have an elevated blood pressure of 161/92. Patient has not been compliant with his blood pressure medication.  12:35 PM HEART score of 2, low risk of MACE.  CXR unremarkable, EKG without acute ischemic changes, normal troponin, normal H&H, normal electrolytes panel.  Pt will benefit from outpt cardiac work up as needed.  Recommend f/u with PCP for further care.  Encourage pt to take his BP med as prescribed.  Doubt hypertensive emergency currently.    2:17 PM Normal delta troponin. Patient is chest pain-[place]. He is resting comfortably. No acute emergent medical  condition identified. At this time patient is stable for discharge. Recommend outpatient follow-up with PCP and likely further management by cardiology as needed. Return precaution discussed.   Fayrene HelperBowie Everardo Voris, PA-C 09/19/16 1419    Raeford RazorStephen Kohut, MD 10/06/16 [phone]

## 2016-10-15 ENCOUNTER — Other Ambulatory Visit: Payer: Self-pay | Admitting: Dermatology

## 2017-12-01 IMAGING — CR DG CHEST 2V
2 series · 2 of 2 positions shown · non-contrast
Comparison: 02/15/2011 .

CLINICAL DATA: Chest pain.

EXAM:
CHEST  2 VIEW

[w chest pa]
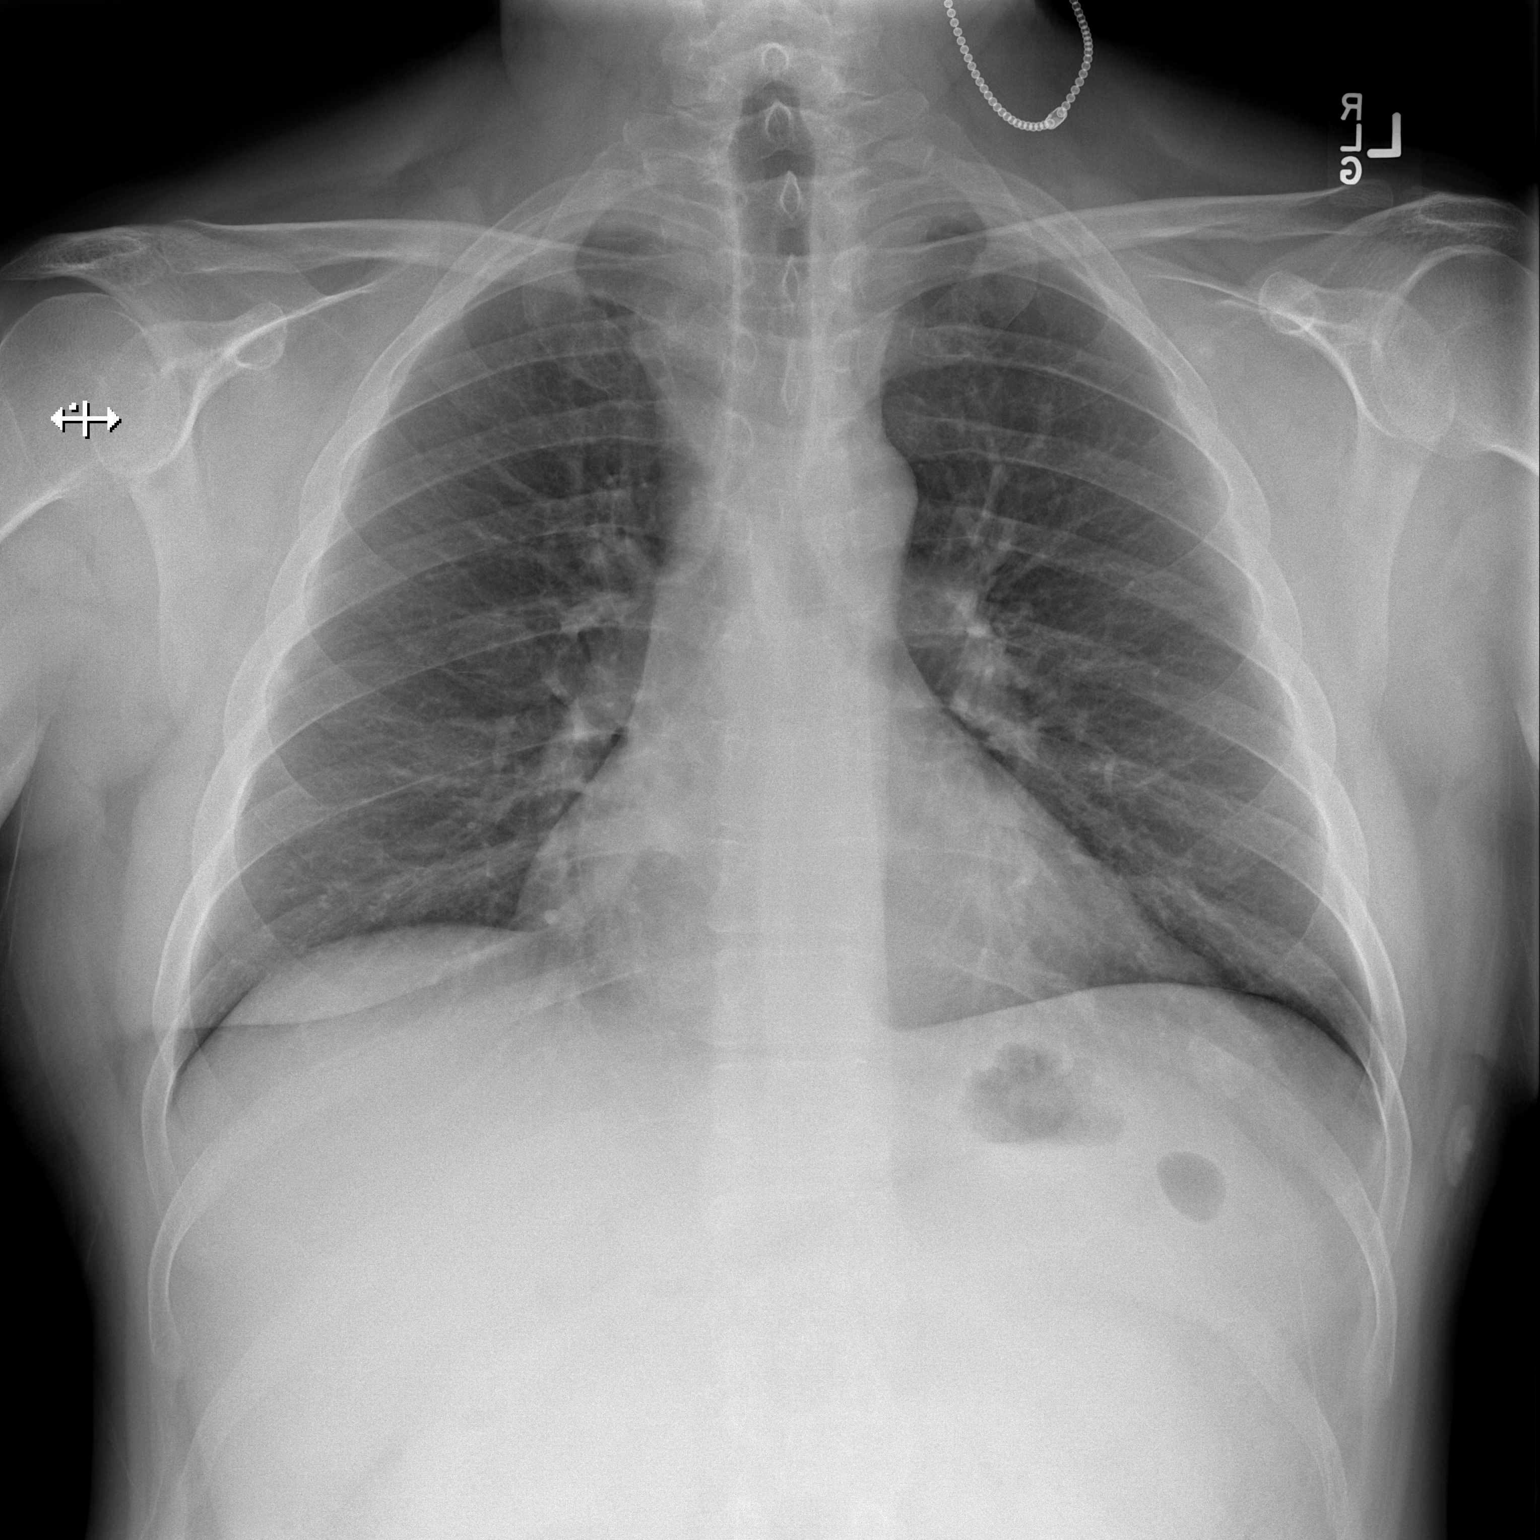

[w chest lat]
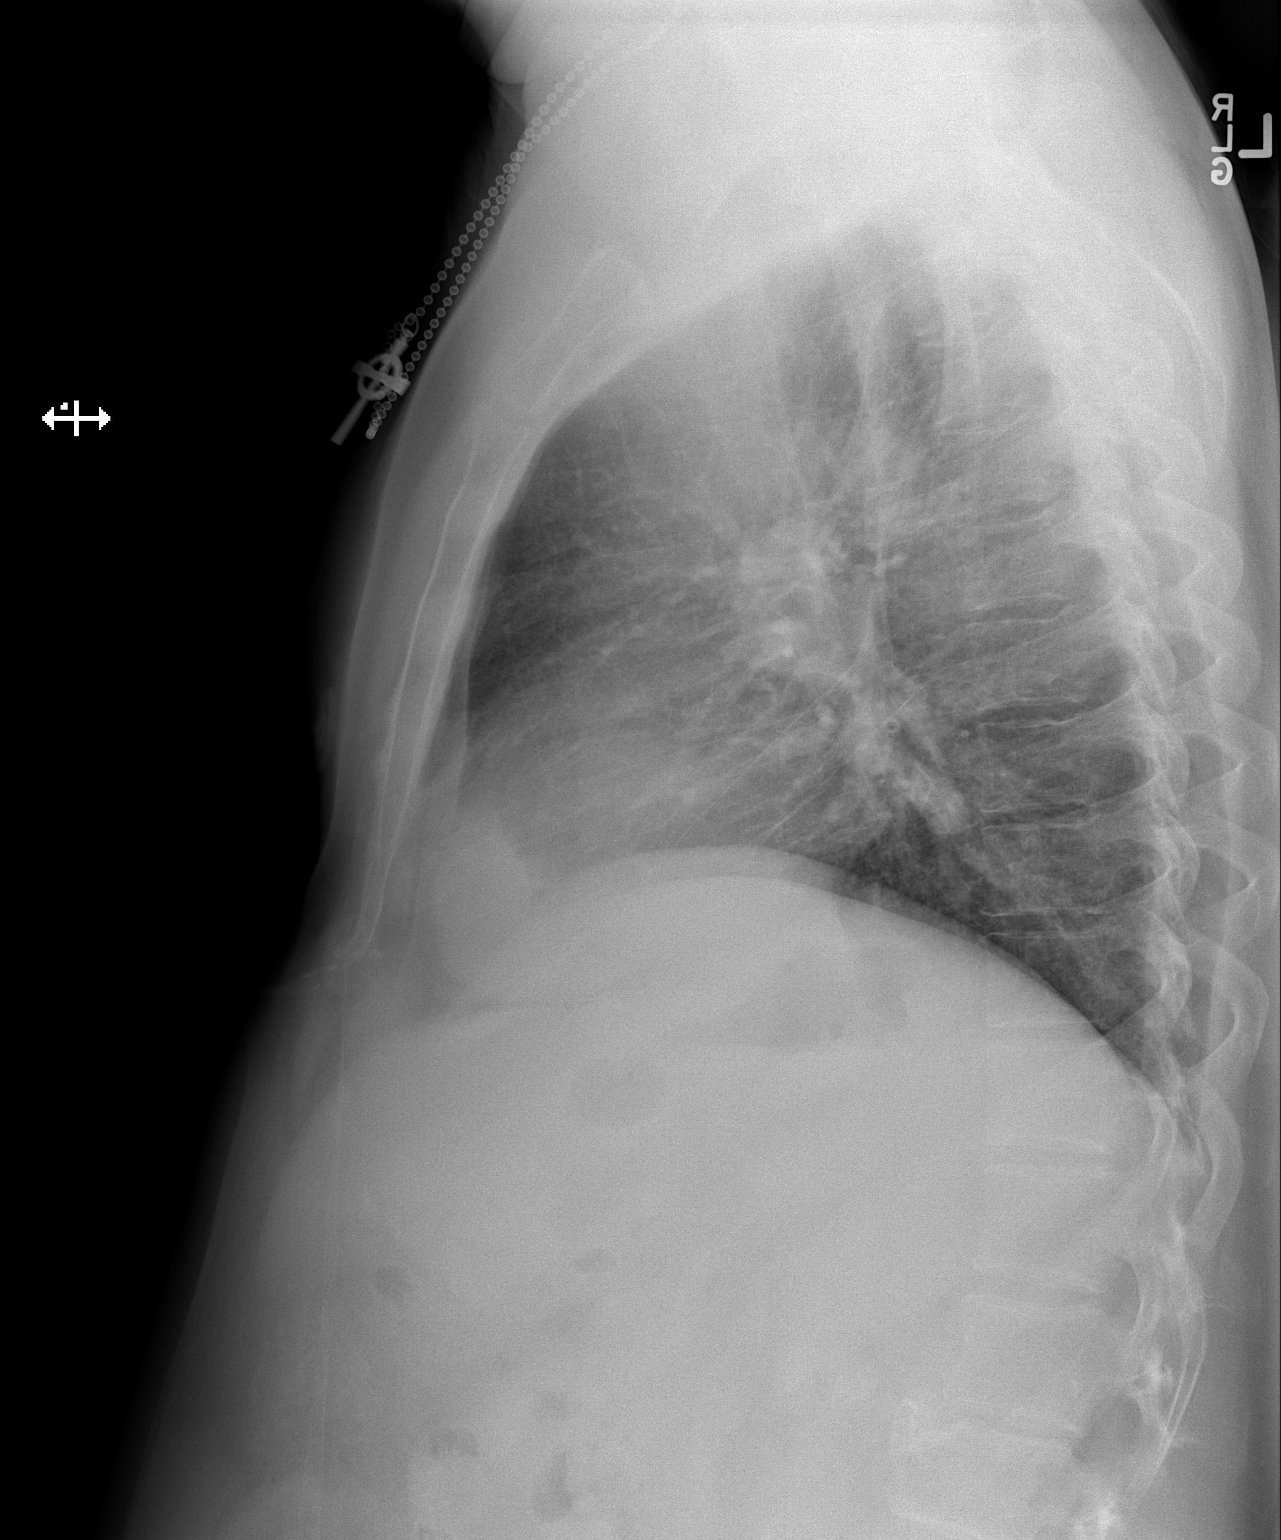

[2 of 2 positions shown; findings below may reference images not displayed]

FINDINGS: Mediastinum hilar structures are normal. Lungs are clear. No pleural
effusion or pneumothorax. No acute bony abnormality .
IMPRESSION: No acute cardiopulmonary disease.
